# Patient Record
Sex: Male | Born: 1995 | Race: White | Hispanic: No | Marital: Single | Smoking: Never smoker
Health system: Southern US, Community
[De-identification: ages and names within clinical notes are randomized; demographics above are authoritative.]

## PROBLEM LIST (undated history)

## (undated) HISTORY — PX: WISDOM TOOTH EXTRACTION: SHX21

---

## 2017-03-30 ENCOUNTER — Other Ambulatory Visit: Payer: Self-pay | Admitting: Sports Medicine

## 2017-03-30 DIAGNOSIS — G8929 Other chronic pain: Secondary | ICD-10-CM

## 2017-03-30 DIAGNOSIS — M5441 Lumbago with sciatica, right side: Principal | ICD-10-CM

## 2017-04-17 ENCOUNTER — Other Ambulatory Visit: Payer: Self-pay | Admitting: Sports Medicine

## 2017-04-17 DIAGNOSIS — G8929 Other chronic pain: Secondary | ICD-10-CM

## 2017-04-17 DIAGNOSIS — M5441 Lumbago with sciatica, right side: Secondary | ICD-10-CM

## 2017-04-17 DIAGNOSIS — K6289 Other specified diseases of anus and rectum: Secondary | ICD-10-CM

## 2017-04-19 ENCOUNTER — Ambulatory Visit: Payer: 59

## 2017-04-23 ENCOUNTER — Ambulatory Visit
Admission: RE | Admit: 2017-04-23 | Discharge: 2017-04-23 | Disposition: A | Discharge status: Home / Self Care | Payer: 59 | Source: Ambulatory Visit | Attending: Sports Medicine | Admitting: Sports Medicine

## 2017-04-23 DIAGNOSIS — K6289 Other specified diseases of anus and rectum: Secondary | ICD-10-CM

## 2017-04-23 DIAGNOSIS — G8929 Other chronic pain: Secondary | ICD-10-CM

## 2017-04-23 DIAGNOSIS — M5441 Lumbago with sciatica, right side: Secondary | ICD-10-CM

## 2019-01-29 IMAGING — MR MR LUMBAR SPINE W/O CM
5 series · 48 of 48 positions shown · non-contrast
Comparison: None.

CLINICAL DATA: Acute low back pain with right-sided sciatica

EXAM:
MRI LUMBAR SPINE WITHOUT CONTRAST
TECHNIQUE: Multiplanar, multisequence MR imaging of the lumbar spine was
performed. No intravenous contrast was administered.

[Series 3: T2 post-contrast · sagittal · 4.0mm · 0.94mm/px · 6 of 17 slices shown]
[im 1/17]
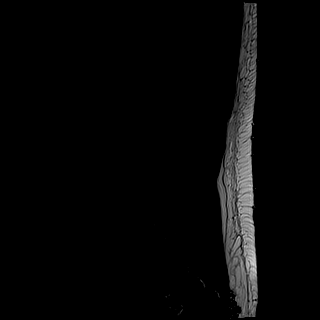
[im 4/17]
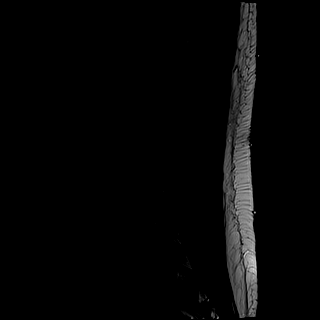
[im 7/17]
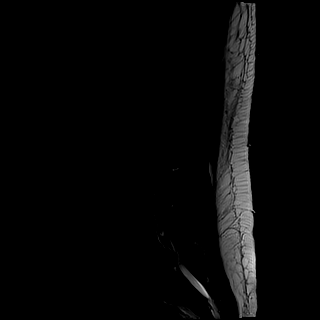
[im 10/17]
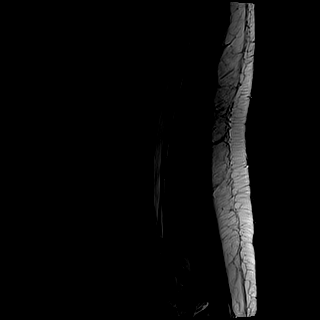
[im 13/17]
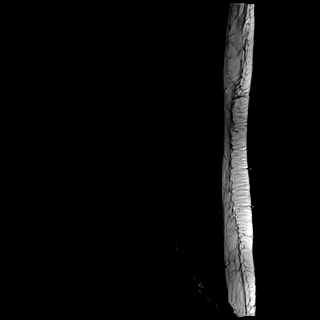
[im 17/17]
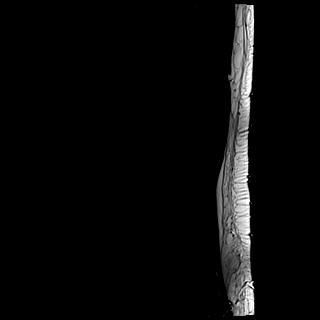

[Series 5: tirm sag · sagittal · 4.0mm · 0.59mm/px · 5 of 17 slices shown]
[im 1/17]
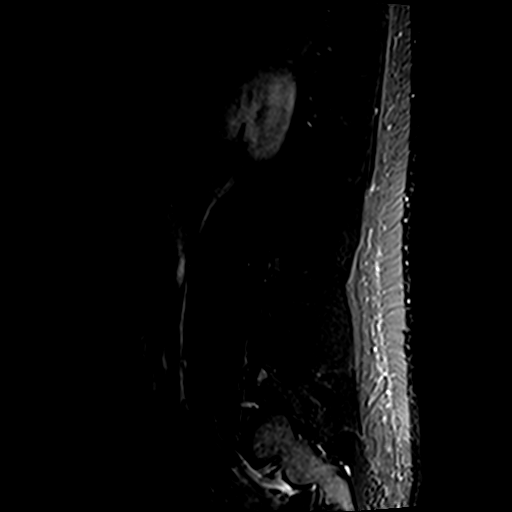
[im 5/17]
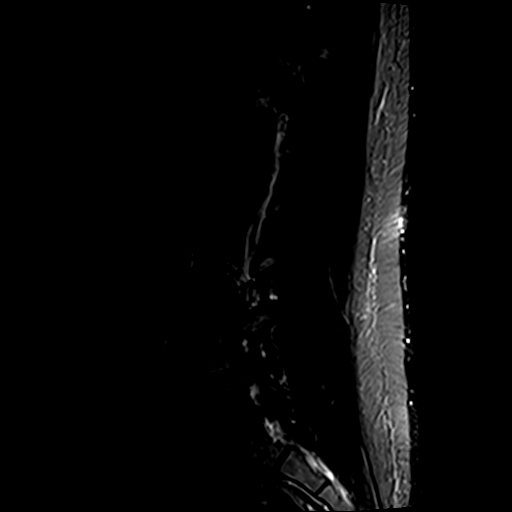
[im 9/17]
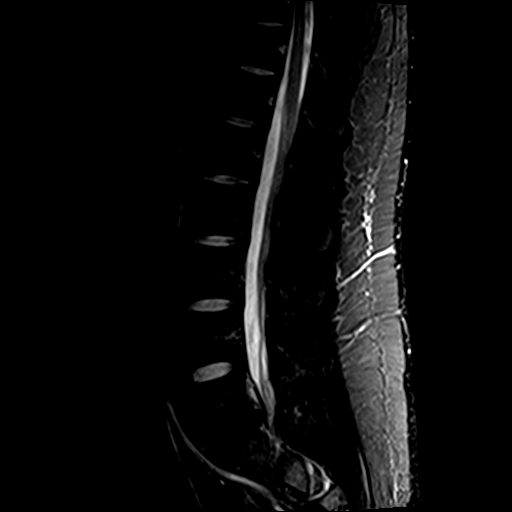
[im 13/17]
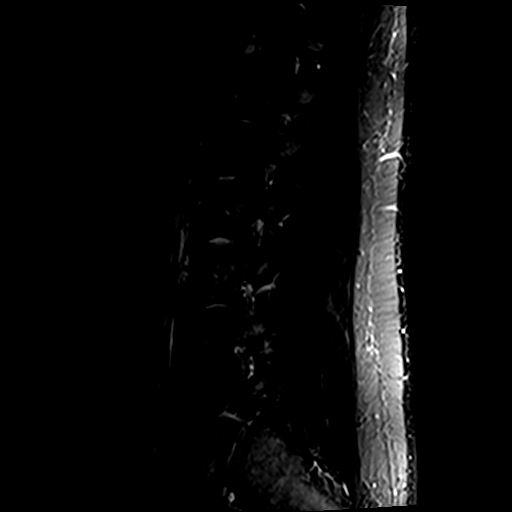
[im 17/17]
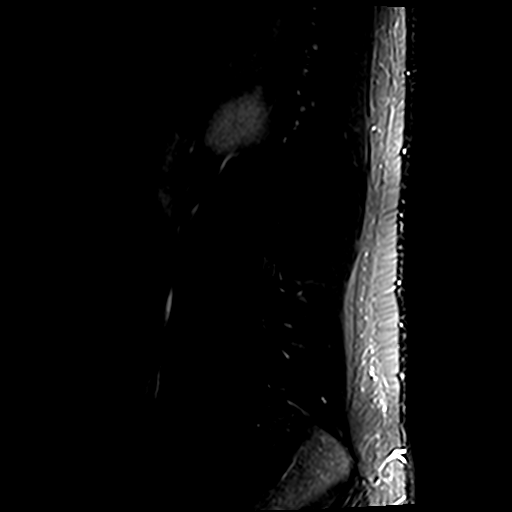

[Series 6: T1 · axial · 4.0mm · 0.78mm/px · z∈[-152,+106]mm · 16 of 51 slices shown (1 of 2)]
[im 1/51]
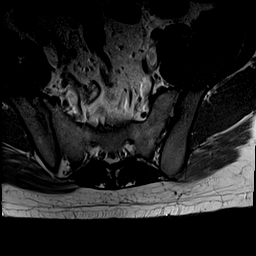
[im 4/51]
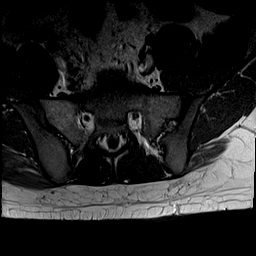
[im 7/51]
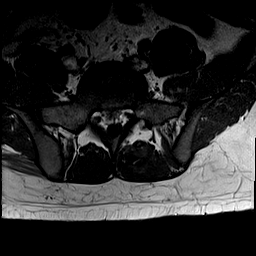
[im 11/51]
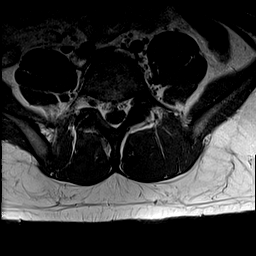
[im 14/51]
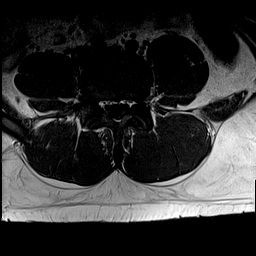
[im 17/51]
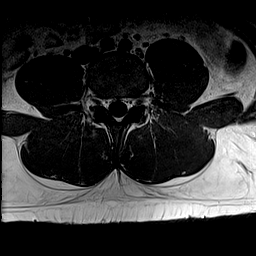
[im 21/51]
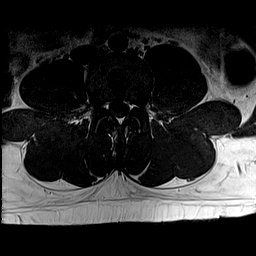
[im 24/51]
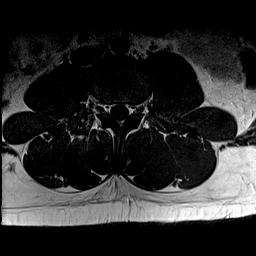
[im 27/51]
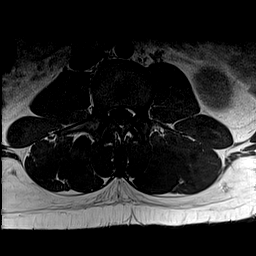
[im 31/51]
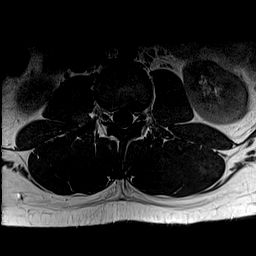
[im 34/51]
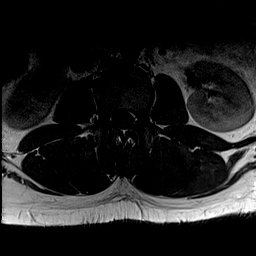
[im 37/51]
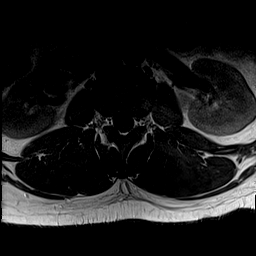
[im 41/51]
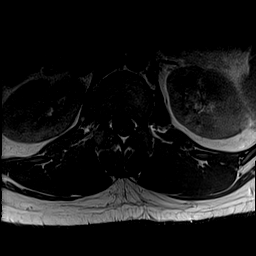
[im 44/51]
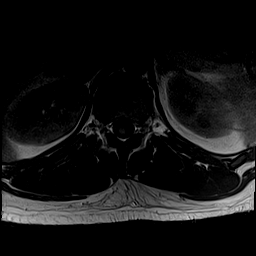
[im 47/51]
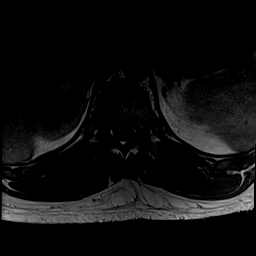
[im 51/51]
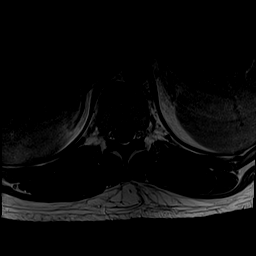

[Series 7: T2 · axial · 4.0mm · 0.78mm/px · z∈[-152,+106]mm · 16 of 51 slices shown]
[im 1/51]
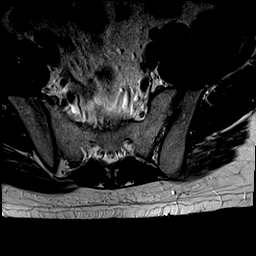
[im 4/51]
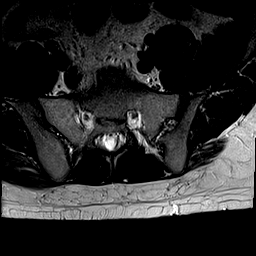
[im 7/51]
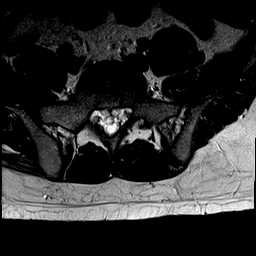
[im 11/51]
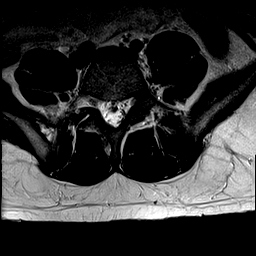
[im 14/51]
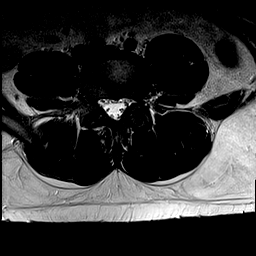
[im 17/51]
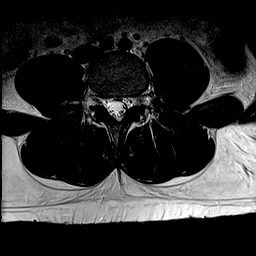
[im 21/51]
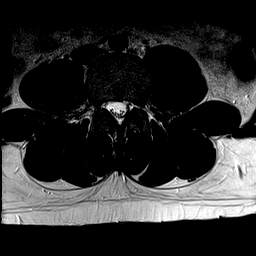
[im 24/51]
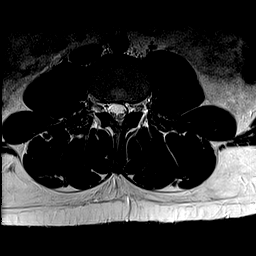
[im 27/51]
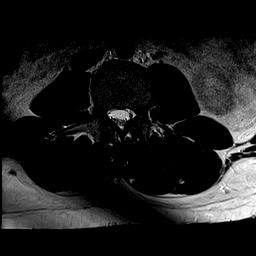
[im 31/51]
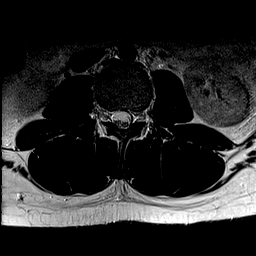
[im 34/51]
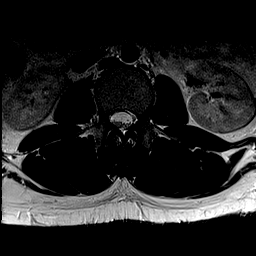
[im 37/51]
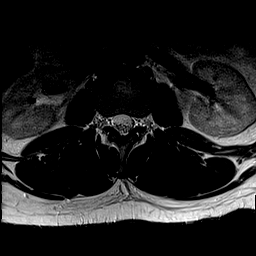
[im 41/51]
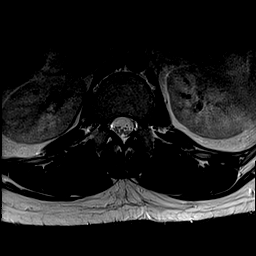
[im 44/51]
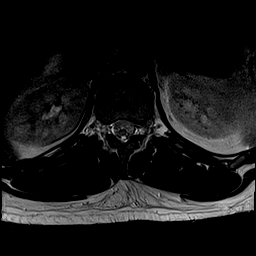
[im 47/51]
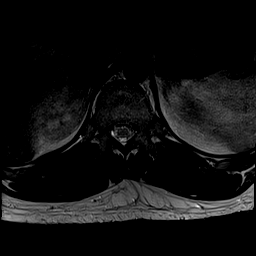
[im 51/51]
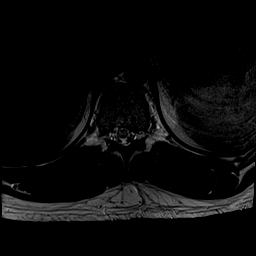

[Series 8: T1 · sagittal · 4.0mm · 0.94mm/px · 5 of 17 slices shown (2 of 2)]
[im 1/17]
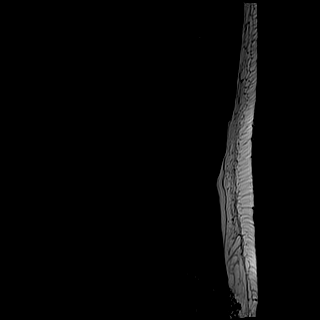
[im 5/17]
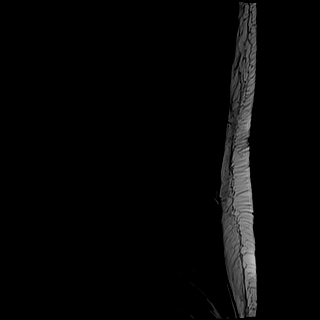
[im 9/17]
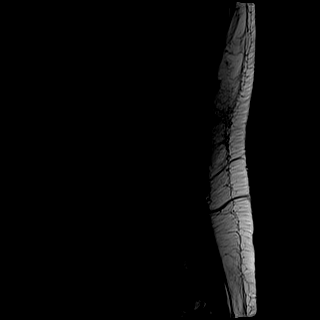
[im 13/17]
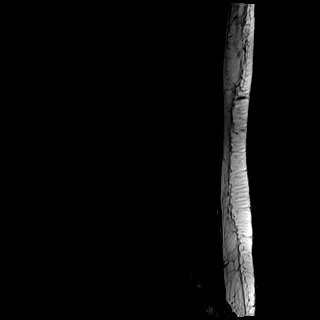
[im 17/17]
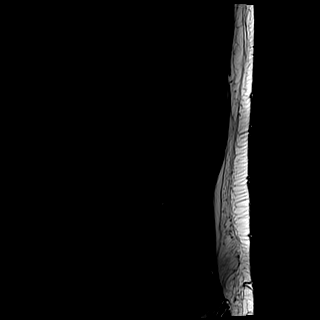

[48 of 48 positions shown; findings below may reference images not displayed]

FINDINGS: Segmentation:  Normal.  Lowest disc space L5-S1

Alignment:  Normal

Vertebrae:  Normal

Conus medullaris and cauda equina: Conus extends to the L1 level.
Conus and cauda equina appear normal.

Paraspinal and other soft tissues: Negative

Disc levels:

L1-2:  Negative

L2-3:  Negative

L3-4:  Negative

L4-5:  Negative

L5-S1: Mild-to-moderate disc space narrowing and disc disc
degeneration. Large right-sided disc protrusion, with compression of
thecal sac and right S1 nerve root. Left S1 nerve root exits freely.
IMPRESSION: Disc degeneration L5-S1. Large right-sided disc protrusion with
compression of thecal sac and right S1 nerve root.

## 2021-03-21 ENCOUNTER — Ambulatory Visit: Admit: 2021-03-21 | Discharge status: Absent without leave | Payer: 59

## 2021-03-24 ENCOUNTER — Other Ambulatory Visit: Payer: Self-pay

## 2021-03-24 ENCOUNTER — Encounter: Payer: Self-pay | Admitting: Radiology

## 2021-03-24 ENCOUNTER — Emergency Department
Admission: EM | Admit: 2021-03-24 | Discharge: 2021-03-24 | Disposition: A | Discharge status: Home / Self Care | Payer: BC Managed Care – PPO | Attending: Emergency Medicine | Admitting: Emergency Medicine

## 2021-03-24 ENCOUNTER — Emergency Department: Payer: BC Managed Care – PPO

## 2021-03-24 ENCOUNTER — Other Ambulatory Visit
Admission: RE | Admit: 2021-03-24 | Discharge: 2021-03-24 | Disposition: A | Discharge status: Home / Self Care | Payer: BC Managed Care – PPO | Source: Ambulatory Visit | Attending: Family Medicine | Admitting: Family Medicine

## 2021-03-24 DIAGNOSIS — R0600 Dyspnea, unspecified: Secondary | ICD-10-CM | POA: Insufficient documentation

## 2021-03-24 DIAGNOSIS — Z03818 Encounter for observation for suspected exposure to other biological agents ruled out: Secondary | ICD-10-CM | POA: Insufficient documentation

## 2021-03-24 DIAGNOSIS — R5383 Other fatigue: Secondary | ICD-10-CM | POA: Diagnosis not present

## 2021-03-24 DIAGNOSIS — R091 Pleurisy: Secondary | ICD-10-CM | POA: Diagnosis present

## 2021-03-24 DIAGNOSIS — R059 Cough, unspecified: Secondary | ICD-10-CM | POA: Insufficient documentation

## 2021-03-24 DIAGNOSIS — R0602 Shortness of breath: Secondary | ICD-10-CM | POA: Insufficient documentation

## 2021-03-24 DIAGNOSIS — Z20822 Contact with and (suspected) exposure to covid-19: Secondary | ICD-10-CM | POA: Diagnosis not present

## 2021-03-24 DIAGNOSIS — R509 Fever, unspecified: Secondary | ICD-10-CM | POA: Diagnosis not present

## 2021-03-24 LAB — CBC WITH DIFFERENTIAL/PLATELET
Abs Immature Granulocytes: 0.01 10*3/uL (ref 0.00–0.07)
Basophils Absolute: 0 10*3/uL (ref 0.0–0.1)
Basophils Relative: 1 %
Eosinophils Absolute: 0.1 10*3/uL (ref 0.0–0.5)
Eosinophils Relative: 3 %
HCT: 43.3 % (ref 39.0–52.0)
Hemoglobin: 14.8 g/dL (ref 13.0–17.0)
Immature Granulocytes: 1 %
Lymphocytes Relative: 37 %
Lymphs Abs: 0.8 10*3/uL (ref 0.7–4.0)
MCH: 30.1 pg (ref 26.0–34.0)
MCHC: 34.2 g/dL (ref 30.0–36.0)
MCV: 88 fL (ref 80.0–100.0)
Monocytes Absolute: 0.2 10*3/uL (ref 0.1–1.0)
Monocytes Relative: 10 %
Neutro Abs: 1.1 10*3/uL — ABNORMAL LOW (ref 1.7–7.7)
Neutrophils Relative %: 48 %
Platelets: 134 10*3/uL — ABNORMAL LOW (ref 150–400)
RBC: 4.92 MIL/uL (ref 4.22–5.81)
RDW: 12.6 % (ref 11.5–15.5)
WBC: 2.2 10*3/uL — ABNORMAL LOW (ref 4.0–10.5)
nRBC: 0 % (ref 0.0–0.2)

## 2021-03-24 LAB — BASIC METABOLIC PANEL
Anion gap: 6 (ref 5–15)
BUN: 9 mg/dL (ref 6–20)
CO2: 28 mmol/L (ref 22–32)
Calcium: 8.7 mg/dL — ABNORMAL LOW (ref 8.9–10.3)
Chloride: 102 mmol/L (ref 98–111)
Creatinine, Ser: 0.88 mg/dL (ref 0.61–1.24)
GFR, Estimated: >60 mL/min (ref >60)
Glucose, Bld: 110 mg/dL — ABNORMAL HIGH (ref 70–99)
Potassium: 3.3 mmol/L — ABNORMAL LOW (ref 3.5–5.1)
Sodium: 136 mmol/L (ref 135–145)

## 2021-03-24 LAB — CBC
HCT: 42.8 % (ref 39.0–52.0)
Hemoglobin: 14.9 g/dL (ref 13.0–17.0)
MCH: 30.5 pg (ref 26.0–34.0)
MCHC: 34.8 g/dL (ref 30.0–36.0)
MCV: 87.7 fL (ref 80.0–100.0)
Platelets: 130 10*3/uL — ABNORMAL LOW (ref 150–400)
RBC: 4.88 MIL/uL (ref 4.22–5.81)
RDW: 12.5 % (ref 11.5–15.5)
WBC: 2.2 10*3/uL — ABNORMAL LOW (ref 4.0–10.5)
nRBC: 0 % (ref 0.0–0.2)

## 2021-03-24 LAB — D-DIMER, QUANTITATIVE
D-Dimer, Quant: 1.82 ug/mL-FEU — ABNORMAL HIGH (ref 0.00–0.50)
D-Dimer, Quant: 1.61 ug/mL-FEU — ABNORMAL HIGH (ref 0.00–0.50)

## 2021-03-24 LAB — RESP PANEL BY RT-PCR (FLU A&B, COVID) ARPGX2
Influenza A by PCR: NEGATIVE
Influenza B by PCR: NEGATIVE
SARS Coronavirus 2 by RT PCR: NEGATIVE

## 2021-03-24 MED ORDER — IOHEXOL 350 MG/ML SOLN
75.0000 mL | Freq: Once | INTRAVENOUS | Status: AC | PRN
  Administered 2021-03-24: 75 mL via INTRAVENOUS

## 2021-03-24 NOTE — ED Provider Notes (Signed)
[facility]  ____________________________________________   Event Date/Time   First MD Initiated Contact with Patient 03/24/21 1839     (approximate)  I have reviewed the triage vital signs and the nursing notes.   HISTORY  Chief Complaint Abnormal Lab    HPI Luis Hendricks is a 25 y.o. male with no significant past medical history who presents due to concern for an elevated D-dimer on outpatient labs.  Patient notes that at the beginning of this month he was treated for bronchitis and then a walking pneumonia.  Overall has been feeling better until last Thursday when he started feeling generally unwell with recurrent cough and some dyspnea.  He also had a fever.  His girlfriend also was feeling unwell.  They were both tested at a walk-in clinic for [place] and he was negative but she was positive.  He went back to the walk-in clinic today and had a chest x-ray and labs apparently and was told he had an elevated D-dimer and needs to come to the emergency department.  The patient does endorse some pleuritic chest pain and dyspnea.The patient denies hx of prior DVT/PE, unilateral leg pain/swelling, hormone use, recent surgery, hx of cancer, prolonged immobilization, or hemoptysis.  Denies exertional chest pain.  Denies nausea vomiting abdominal pain diarrhea.          History reviewed. No pertinent past medical history.  There are no problems to display for this patient.  No PMH  Prior to Admission medications   Not on File    Allergies Patient has no known allergies.  No family history on file.  Social History    Review of Systems   Review of Systems  Constitutional:  Positive for appetite change, chills and fever.  Respiratory:  Positive for cough and shortness of breath.   Cardiovascular:  Positive for chest pain.  Gastrointestinal:  Negative for abdominal pain, nausea and vomiting.  All other systems reviewed and are negative.  Physical  Exam Updated Vital Signs BP 124/65   Pulse 77   Temp 98.7 F (37.1 C) (Oral)   Resp 20   Ht 5\' 10"  (1.778 m)   Wt 97.5 kg   SpO2 98%   BMI 30.85 kg/m   Physical Exam Vitals and nursing note reviewed.  Constitutional:      General: He is not in acute distress.    Appearance: Normal appearance.  HENT:     Head: Normocephalic and atraumatic.  Eyes:     General: No scleral icterus.    Conjunctiva/sclera: Conjunctivae normal.  Cardiovascular:     Rate and Rhythm: Normal rate and regular rhythm.  Pulmonary:     Effort: Pulmonary effort is normal. No respiratory distress.     Breath sounds: Normal breath sounds. No wheezing.  Musculoskeletal:        General: No deformity or signs of injury.     Cervical back: Normal range of motion.     Right lower leg: No edema.     Left lower leg: No edema.  Skin:    Coloration: Skin is not jaundiced or pale.  Neurological:     General: No focal deficit present.     Mental Status: He is alert and oriented to person, place, and time. Mental status is at baseline.  Psychiatric:        Mood and Affect: Mood normal.        Behavior: Behavior normal.     LABS (all labs ordered are listed,  but only abnormal results are displayed)  Labs Reviewed  CBC - Abnormal; Notable for the following components:      Result Value   WBC 2.2 (*)    Platelets 130 (*)    All other components within normal limits  BASIC METABOLIC PANEL - Abnormal; Notable for the following components:   Potassium 3.3 (*)    Glucose, Bld 110 (*)    Calcium 8.7 (*)    All other components within normal limits  D-DIMER, QUANTITATIVE - Abnormal; Notable for the following components:   D-Dimer, Quant 1.61 (*)    All other components within normal limits  CBC WITH DIFFERENTIAL/PLATELET - Abnormal; Notable for the following components:   WBC 2.2 (*)    Platelets 134 (*)    All other components within normal limits  RESP PANEL BY RT-PCR (FLU A&B, COVID) [id number]    ____________________________________________  EKG  NSR, nml axis, nml intervals, no acute ischemic changes  ____________________________________________  RADIOLOGY Ky Barban, personally viewed and evaluated these images (plain radiographs) as part of my medical decision making, as well as reviewing the written report by the radiologist.  ED MD interpretation: I reviewed the CT angio of the chest which is negative for PE    ____________________________________________   PROCEDURES  Procedure(s) performed (including Critical Care):  Procedures   ____________________________________________   INITIAL IMPRESSION / ASSESSMENT AND PLAN / ED COURSE   Patient is a 25 year old previously healthy male who presents with shortness of breath cough and fevers.  He was seen at an urgent care apparently had a negative chest x-ray but a D-dimer sent given his pleuritic chest pain which was elevated so he was advised to come to the emergency department.  Signs within normal limits, he is not tachycardic or hypoxic.  He appears well on exam with a unremarkable cardiopulmonary exam no signs of DVT on exam.  He does have pleuritic chest pain which I suspect is in the setting of some viral illness.  He is technically PERC negative however with this elevated D-dimer and the fact that he does have symptoms feel that I am obligated to obtain a CT angio to rule out pulmonary embolism.  If CT is negative, anticipate discharge.  Patient CTA is negative for PE.  Suspect pleurisy in the setting of his recent infection.  We discussed supportive care.  Return precautions discussed as well.    ____________________________________________   FINAL CLINICAL IMPRESSION(S) / ED DIAGNOSES  Final diagnoses:  Pleurisy     ED Discharge Orders     None        Note:  This document was prepared using Dragon voice recognition software and may include unintentional dictation errors.     Georga Hacking, MD 03/24/21 2042

## 2021-03-24 NOTE — Discharge Instructions (Addendum)
The CAT scan of your lungs was negative for blood clot.  You likely have pleurisy related to a recent viral or bacterial infection.  You can take Tylenol Motrin for pain.

## 2021-03-24 NOTE — ED Triage Notes (Signed)
Pt here with elevated d dimer. Pt having muscle aches, nausea, cough, fever, and chills. Pt in NAD in triage. Pt was tested for covid, negative result.

## 2021-03-24 NOTE — ED Triage Notes (Signed)
Pt was sent from [facility] with elevated D-dimer

## 2021-03-24 NOTE — ED Notes (Signed)
Report received from Maggie, RN

## 2023-08-27 ENCOUNTER — Ambulatory Visit: Admission: EM | Admit: 2023-08-27 | Discharge: 2023-08-27 | Disposition: A | Discharge status: Home / Self Care

## 2023-08-27 DIAGNOSIS — Z23 Encounter for immunization: Secondary | ICD-10-CM | POA: Diagnosis not present

## 2023-08-27 DIAGNOSIS — W57XXXA Bitten or stung by nonvenomous insect and other nonvenomous arthropods, initial encounter: Secondary | ICD-10-CM

## 2023-08-27 DIAGNOSIS — S30851A Superficial foreign body of abdominal wall, initial encounter: Secondary | ICD-10-CM | POA: Diagnosis not present

## 2023-08-27 DIAGNOSIS — S30861A Insect bite (nonvenomous) of abdominal wall, initial encounter: Secondary | ICD-10-CM | POA: Diagnosis not present

## 2023-08-27 MED ORDER — DOXYCYCLINE HYCLATE 100 MG PO CAPS
200.0000 mg | ORAL_CAPSULE | Freq: Once | ORAL | 0 refills | Status: AC
Start: 2023-08-27 — End: 2023-08-27

## 2023-08-27 MED ORDER — TETANUS-DIPHTH-ACELL PERTUSSIS 5-2.5-18.5 LF-MCG/0.5 IM SUSY
0.5000 mL | PREFILLED_SYRINGE | Freq: Once | INTRAMUSCULAR | Status: AC
  Administered 2023-08-27: 0.5 mL via INTRAMUSCULAR

## 2023-08-27 NOTE — Discharge Instructions (Signed)
 We were able to remove the head of the tick.  Keep this area clean with soap and water.  If you have any signs of infection including redness, swelling, pain, drainage please return for reevaluation.  Take the one-time dose of 200 mg of doxycycline as we discussed.  We updated your tetanus today.  If you develop any additional symptoms please return for reevaluation.

## 2023-08-27 NOTE — ED Triage Notes (Signed)
 Patient to Urgent Care with complaints of a tick bite on his abdomen that occurred yesterday.   Patient was able to remove the tick- believes some of the head is retained in his skin. No drainage. Attempted to remove the head at home without success.

## 2023-08-27 NOTE — ED Provider Notes (Signed)
 Luis Hendricks    CSN: [id number] Arrival date & time: 08/27/23  0836      History   Chief Complaint Chief Complaint  Patient presents with   Insect Bite    HPI Luis Hendricks is a 28 y.o. male.   Patient presents today with a several hour history of embedded head of a tick on his abdomen.  He believes that the tick has been present for approximately 1 day that he got it when he was outside taking out the trash.  He was able to remove the body of the tick and that is buried and so he is requesting assistance removing this.  He is unsure when his last tetanus was but believes that this was last done in 2017.  He is currently on amoxicillin for another reason but denies additional antibiotics in the past 90 days.  He denies any symptoms including pain at the lesion, fever, nausea, vomiting, headache, facial asymmetry, dizziness, palpitations, weakness.    History reviewed. No pertinent past medical history.  There are no active problems to display for this patient.   Past Surgical History:  Procedure Laterality Date   WISDOM TOOTH EXTRACTION         Home Medications    Prior to Admission medications   Medication Sig Start Date End Date Taking? Authorizing Provider  amoxicillin (AMOXIL) 875 MG tablet Take 875 mg by mouth. 08/25/23 09/01/23 Yes [provider]  doxycycline (VIBRAMYCIN) 100 MG capsule Take 2 capsules (200 mg total) by mouth once for 1 dose. 08/27/23 08/27/23 Yes Jayvion Stefanski, Betsey Brow, PA-C    Family History History reviewed. No pertinent family history.  Social History Social History   Tobacco Use   Smoking status: Never   Smokeless tobacco: Never  Vaping Use   Vaping status: Never Used  Substance Use Topics   Alcohol use: Never   Drug use: Never     Allergies   Levofloxacin   Review of Systems Review of Systems  Constitutional:  Negative for activity change, appetite change, fatigue and fever.  Cardiovascular:  Negative for chest pain and  palpitations.  Musculoskeletal:  Negative for arthralgias and myalgias.  Skin:  Positive for wound. Negative for color change.  Neurological:  Negative for dizziness, facial asymmetry, light-headedness and headaches.     Physical Exam Triage Vital Signs ED Triage Vitals  Encounter Vitals Group     BP 08/27/23 0844 122/75     Systolic BP Percentile --      Diastolic BP Percentile --      Pulse Rate 08/27/23 0844 70     Resp 08/27/23 0844 18     Temp 08/27/23 0844 98 F (36.7 C)     Temp src --      SpO2 08/27/23 0844 97 %     Weight --      Height --      Head Circumference --      Peak Flow --      Pain Score 08/27/23 0841 1     Pain Loc --      Pain Education --      Exclude from Growth Chart --    No data found.  Updated Vital Signs BP 122/75   Pulse 70   Temp 98 F (36.7 C)   Resp 18   SpO2 97%   Visual Acuity Right Eye Distance:   Left Eye Distance:   Bilateral Distance:    Right Eye Near:   Left  Eye Near:    Bilateral Near:     Physical Exam Vitals reviewed.  Constitutional:      General: He is awake.     Appearance: Normal appearance. He is well-developed. He is not ill-appearing.     Comments: Very pleasant male appears stated age in no acute distress sitting comfortably in exam room  HENT:     Head: Normocephalic and atraumatic.  Cardiovascular:     Rate and Rhythm: Normal rate and regular rhythm.     Heart sounds: Normal heart sounds, S1 normal and S2 normal. No murmur heard. Pulmonary:     Effort: Pulmonary effort is normal.     Breath sounds: Normal breath sounds. No stridor. No wheezing, rhonchi or rales.     Comments: Clear to auscultation bilaterally Abdominal:     Palpations: Abdomen is soft.     Tenderness: There is no abdominal tenderness.  Skin:    Findings: Wound present.          Comments: 2 mm open wound with small dark foreign body noted deep to opening.  No surrounding erythema.  No active bleeding or drainage noted.   Neurological:     Mental Status: He is alert.  Psychiatric:        Behavior: Behavior is cooperative.      UC Treatments / Results  Labs (all labs ordered are listed, but only abnormal results are displayed) Labs Reviewed - No data to display  EKG   Radiology No results found.  Procedures Procedures (including critical care time)  Medications Ordered in UC Medications  Tdap (BOOSTRIX) injection 0.5 mL (0.5 mLs Intramuscular Given 08/27/23 0902)    Initial Impression / Assessment and Plan / UC Course  I have reviewed the triage vital signs and the nursing notes.  Pertinent labs & imaging results that were available during my care of the patient were reviewed by me and considered in my medical decision making (see chart for details).     Patient is well-appearing, afebrile, nontoxic, nontachycardic.  Small embedded foreign body consistent with a portion of insect noted at entrance to wound.  After cleaning with alcohol and draped in typical sterile fashion this area was injected with 1 mL 1% lidocaine without lidocaine and after adequate analgesia explored with 18-gauge needle with successful removal of foreign body.  Adequate hemostasis was obtained and patient tolerated procedure without complication.  Patient is unsure how long the tick was attached so will use prophylactic dose of doxycycline (200 mg x 1) to cover for tickborne illness.  Tetanus was updated.  He was encouraged to keep the area clean and we discussed that if he has any signs of infections or additional symptoms he should return for reevaluation.  Strict return precautions given.    Final Clinical Impressions(s) / UC Diagnoses   Final diagnoses:  Foreign body in skin of abdomen  Tick bite of abdomen, initial encounter     Discharge Instructions      We were able to remove the head of the tick.  Keep this area clean with soap and water.  If you have any signs of infection including redness, swelling,  pain, drainage please return for reevaluation.  Take the one-time dose of 200 mg of doxycycline as we discussed.  We updated your tetanus today.  If you develop any additional symptoms please return for reevaluation.     ED Prescriptions     Medication Sig Dispense Auth. Provider   doxycycline (VIBRAMYCIN) 100 MG  capsule Take 2 capsules (200 mg total) by mouth once for 1 dose. 2 capsule Lisa Milian K, PA-C      PDMP not reviewed this encounter.   Budd Cargo, PA-C 08/27/23 0102
